# Patient Record
Sex: Male | Born: 1985 | Race: White | Hispanic: No | Marital: Married | Smoking: Never smoker
Health system: Southern US, Community
[De-identification: ages and names within clinical notes are randomized; demographics above are authoritative.]

## PROBLEM LIST (undated history)

## (undated) DIAGNOSIS — I10 Essential (primary) hypertension: Secondary | ICD-10-CM

## (undated) DIAGNOSIS — J45990 Exercise induced bronchospasm: Secondary | ICD-10-CM

---

## 2005-01-09 ENCOUNTER — Emergency Department (HOSPITAL_COMMUNITY): Admission: EM | Admit: 2005-01-09 | Discharge: 2005-01-09 | Payer: Self-pay | Admitting: Emergency Medicine

## 2009-02-21 ENCOUNTER — Encounter: Admission: RE | Admit: 2009-02-21 | Discharge: 2009-02-21 | Payer: Self-pay | Admitting: Family Medicine

## 2012-02-18 ENCOUNTER — Other Ambulatory Visit: Payer: Self-pay | Admitting: Occupational Medicine

## 2012-02-18 ENCOUNTER — Ambulatory Visit
Admission: RE | Admit: 2012-02-18 | Discharge: 2012-02-18 | Disposition: A | Discharge status: Home / Self Care | Payer: No Typology Code available for payment source | Source: Ambulatory Visit | Attending: Occupational Medicine | Admitting: Occupational Medicine

## 2012-02-18 DIAGNOSIS — Z021 Encounter for pre-employment examination: Secondary | ICD-10-CM

## 2014-05-02 ENCOUNTER — Encounter (HOSPITAL_COMMUNITY): Payer: Self-pay | Admitting: Emergency Medicine

## 2014-05-02 ENCOUNTER — Emergency Department (INDEPENDENT_AMBULATORY_CARE_PROVIDER_SITE_OTHER): Payer: Worker's Compensation

## 2014-05-02 ENCOUNTER — Emergency Department (INDEPENDENT_AMBULATORY_CARE_PROVIDER_SITE_OTHER)
Admission: EM | Admit: 2014-05-02 | Discharge: 2014-05-02 | Disposition: A | Discharge status: Home / Self Care | Payer: Worker's Compensation | Source: Home / Self Care | Attending: Family Medicine | Admitting: Family Medicine

## 2014-05-02 DIAGNOSIS — T148 Other injury of unspecified body region: Secondary | ICD-10-CM

## 2014-05-02 DIAGNOSIS — IMO0002 Reserved for concepts with insufficient information to code with codable children: Secondary | ICD-10-CM

## 2014-05-02 MED ORDER — CEPHALEXIN 500 MG PO CAPS: 500.0000 mg | ORAL_CAPSULE | Freq: Two times a day (BID) | ORAL | Status: DC

## 2014-05-02 NOTE — ED Notes (Signed)
Pt was at work and was knocking on a window and his arm went the the window causing laceration to right arm.

## 2014-05-02 NOTE — ED Provider Notes (Addendum)
CSN: [id number]     Arrival date & time 05/02/14  1855 History   First MD Initiated Contact with Patient 05/02/14 1941     Chief Complaint  Patient presents with  . Extremity Laceration   (Consider location/radiation/quality/duration/timing/severity/associated sxs/prior Treatment) HPI  R forearm extremity laceration. Occurred at 18:30. Arm went through the window at work. This was an accident patient does not endorse potentially putting his arm to the window. Patient states that sensation and movement of all digits and hand are intact. Copious bleeding which was stopped with gauze and pressure. Symptoms constant but not getting worse. Denies chest pain, palpitations, shortness of breath, headache, syncope, lightheadedness, dizziness.    History reviewed. No pertinent past medical history. History reviewed. No pertinent past surgical history. Family History  Problem Relation Age of Onset  . Hypertension Father    History  Substance Use Topics  . Smoking status: Never Smoker   . Smokeless tobacco: Not on file  . Alcohol Use: No    Review of Systems Per HPI with all other pertinent systems negative.   Allergies  Review of patient's allergies indicates no known allergies.  Home Medications   Prior to Admission medications   Not on File   BP 140/87 mmHg  Pulse 82  Temp(Src) 98.5 F (36.9 C) (Oral)  Resp 18  SpO2 96% Physical Exam Physical Exam  Constitutional: oriented to person, place, and time. appears well-developed and well-nourished. No distress.  HENT:  Head: Normocephalic and atraumatic.  Eyes: EOMI. PERRL.  Neck: Normal range of motion.  Cardiovascular: RRR, no m/r/g, 2+ distal pulses,  Pulmonary/Chest: Effort normal and breath sounds normal. No respiratory distress.  Abdominal: Soft. Bowel sounds are normal. NonTTP, no distension.  Musculoskeletal: Fingers and hand with full range of motion, sensation intact, less than 2 second cap refill.  Neurological: alert  and oriented to person, place, and time.  Skin: Large oblique, angulated wound on the medial surface of the right forearm with skin retraction distally.Marland Kitchen.  Psychiatric: normal mood and affect. behavior is normal. Judgment and thought content normal.          ED Course  LACERATION REPAIR Date/Time: 05/02/2014 8:21 PM Performed by: Konrad DoloresMERRELL, Shaan Rhoads J Authorized by: Konrad DoloresMERRELL, Bethel Gaglio J Consent: Verbal consent obtained. Risks and benefits: risks, benefits and alternatives were discussed Consent given by: patient Patient identity confirmed: verbally with patient Location: Right distal forearm. Laceration length: 2.7 cm Irrigation solution: Betadine and saline. Amount of cleaning: standard Debridement: none Degree of undermining: none Skin closure: 3-0 Prolene Subcutaneous closure: 4-0 Vicryl Number of sutures: 5 subcuticular and 7 superficial. Technique: simple Approximation: close Approximation difficulty: complex Dressing: antibiotic ointment Patient tolerance: Patient tolerated the procedure well with no immediate complications   (including critical care time) Labs Review Labs Reviewed - No data to display  Imaging Review No results found.   MDM   1. Laceration    Laceration repair as above. Tetanus shot up-to-date. No nerve, artery, tendon, ligament involvement. No foreign body noted on x-ray. Keflex 500 mg twice a day 3 days prophylactically. Anabolic ointment daily and daily warm soapy water washing until sutures are removed in 10-14 days. Sutures removed in 10 days patient to apply Steri-Strips. Very strict return precautions discussed with regards to physical activity.   Ozella Rocksavid J Aleane Wesenberg, MD 05/02/14 [id number]  Ozella Rocksavid J Tayden Nichelson, MD 05/02/14 2136

## 2014-05-02 NOTE — Discharge Instructions (Signed)
The cut to your arm forcefully did not injure any major tendons ligaments blood vessels or nerves. This was repaired using subcutaneous and superficial sutures. The subcutaneous sutures will dissolve over the course of several weeks. Please take the superficial sutures out in 10-14 days. Please apply antibiotic ointment every day until the sutures come out. Please apply Steri-Strips to the cut if you take the sutures out in 10 days. The actual wound should be completely closed initially allowed to work and as little as 5 days. Please avoid any direct trauma to your arm until the sutures are out. Please take the antibiotics as prescribed.

## 2014-07-19 ENCOUNTER — Encounter (HOSPITAL_COMMUNITY)
Admission: EM | Disposition: A | Discharge status: Home / Self Care | Payer: Self-pay | Source: Home / Self Care | Attending: Emergency Medicine

## 2014-07-19 ENCOUNTER — Observation Stay (HOSPITAL_COMMUNITY): Payer: 59 | Admitting: Anesthesiology

## 2014-07-19 ENCOUNTER — Encounter (HOSPITAL_COMMUNITY): Payer: Self-pay | Admitting: Physical Medicine and Rehabilitation

## 2014-07-19 ENCOUNTER — Emergency Department (HOSPITAL_COMMUNITY): Payer: 59

## 2014-07-19 ENCOUNTER — Observation Stay (HOSPITAL_COMMUNITY)
Admission: EM | Admit: 2014-07-19 | Discharge: 2014-07-20 | Disposition: A | Discharge status: Home / Self Care | Payer: 59 | Attending: General Surgery | Admitting: General Surgery

## 2014-07-19 DIAGNOSIS — K429 Umbilical hernia without obstruction or gangrene: Secondary | ICD-10-CM | POA: Insufficient documentation

## 2014-07-19 DIAGNOSIS — N2889 Other specified disorders of kidney and ureter: Secondary | ICD-10-CM | POA: Diagnosis not present

## 2014-07-19 DIAGNOSIS — I10 Essential (primary) hypertension: Secondary | ICD-10-CM | POA: Insufficient documentation

## 2014-07-19 DIAGNOSIS — K5730 Diverticulosis of large intestine without perforation or abscess without bleeding: Secondary | ICD-10-CM | POA: Insufficient documentation

## 2014-07-19 DIAGNOSIS — R112 Nausea with vomiting, unspecified: Secondary | ICD-10-CM | POA: Diagnosis present

## 2014-07-19 DIAGNOSIS — K37 Unspecified appendicitis: Secondary | ICD-10-CM | POA: Diagnosis present

## 2014-07-19 DIAGNOSIS — K353 Acute appendicitis with localized peritonitis, without perforation or gangrene: Secondary | ICD-10-CM

## 2014-07-19 DIAGNOSIS — K3580 Unspecified acute appendicitis: Principal | ICD-10-CM | POA: Insufficient documentation

## 2014-07-19 HISTORY — DX: Exercise induced bronchospasm: J45.990

## 2014-07-19 HISTORY — PX: APPENDECTOMY: SHX54

## 2014-07-19 HISTORY — DX: Essential (primary) hypertension: I10

## 2014-07-19 HISTORY — PX: LAPAROSCOPIC APPENDECTOMY: SHX408

## 2014-07-19 LAB — COMPREHENSIVE METABOLIC PANEL
ALBUMIN: 4.3 g/dL (ref 3.5–5.0)
ALK PHOS: 71 U/L (ref 38–126)
ALT: 45 U/L (ref 17–63)
AST: 30 U/L (ref 15–41)
Anion gap: 10 (ref 5–15)
BUN: 12 mg/dL (ref 6–20)
CO2: 27 mmol/L (ref 22–32)
Calcium: 9.5 mg/dL (ref 8.9–10.3)
Chloride: 100 mmol/L — ABNORMAL LOW (ref 101–111)
Creatinine, Ser: 1.12 mg/dL (ref 0.61–1.24)
GFR calc Af Amer: >60 mL/min (ref >60)
GFR calc non Af Amer: >60 mL/min (ref >60)
GLUCOSE: 111 mg/dL — AB (ref 65–99)
Potassium: 4.2 mmol/L (ref 3.5–5.1)
Sodium: 137 mmol/L (ref 135–145)
TOTAL PROTEIN: 7.4 g/dL (ref 6.5–8.1)
Total Bilirubin: 0.7 mg/dL (ref 0.3–1.2)

## 2014-07-19 LAB — URINALYSIS, ROUTINE W REFLEX MICROSCOPIC
Bilirubin Urine: NEGATIVE
GLUCOSE, UA: NEGATIVE mg/dL
Hgb urine dipstick: NEGATIVE
KETONES UR: NEGATIVE mg/dL
LEUKOCYTES UA: NEGATIVE
Nitrite: NEGATIVE
PROTEIN: NEGATIVE mg/dL
Specific Gravity, Urine: 1.019 (ref 1.005–1.030)
Urobilinogen, UA: 0.2 mg/dL (ref 0.0–1.0)
pH: 7 (ref 5.0–8.0)

## 2014-07-19 LAB — CBC WITH DIFFERENTIAL/PLATELET
Basophils Absolute: 0 10*3/uL (ref 0.0–0.1)
Basophils Relative: 0 % (ref 0–1)
Eosinophils Absolute: 0 10*3/uL (ref 0.0–0.7)
Eosinophils Relative: 0 % (ref 0–5)
HCT: 44.2 % (ref 39.0–52.0)
Hemoglobin: 16.1 g/dL (ref 13.0–17.0)
Lymphocytes Relative: 4 % — ABNORMAL LOW (ref 12–46)
Lymphs Abs: 0.8 10*3/uL (ref 0.7–4.0)
MCH: 29.2 pg (ref 26.0–34.0)
MCHC: 36.4 g/dL — ABNORMAL HIGH (ref 30.0–36.0)
MCV: 80.1 fL (ref 78.0–100.0)
Monocytes Absolute: 0.9 10*3/uL (ref 0.1–1.0)
Monocytes Relative: 5 % (ref 3–12)
Neutro Abs: 17.9 10*3/uL — ABNORMAL HIGH (ref 1.7–7.7)
Neutrophils Relative %: 91 % — ABNORMAL HIGH (ref 43–77)
Platelets: 266 10*3/uL (ref 150–400)
RBC: 5.52 MIL/uL (ref 4.22–5.81)
RDW: 11.6 % (ref 11.5–15.5)
WBC: 19.6 10*3/uL — ABNORMAL HIGH (ref 4.0–10.5)

## 2014-07-19 LAB — LIPASE, BLOOD: Lipase: 12 U/L — ABNORMAL LOW (ref 22–51)

## 2014-07-19 SURGERY — APPENDECTOMY, LAPAROSCOPIC
Anesthesia: General | Site: Abdomen

## 2014-07-19 MED ORDER — DEXTROSE 5 % IV SOLN
2.0000 g | INTRAVENOUS | Status: AC
  Administered 2014-07-19: 2 g via INTRAVENOUS
  Filled 2014-07-19: qty 2

## 2014-07-19 MED ORDER — HYDROMORPHONE HCL 1 MG/ML IJ SOLN
0.2500 mg | INTRAMUSCULAR | Status: DC | PRN
  Administered 2014-07-19 (×2): 0.5 mg via INTRAVENOUS

## 2014-07-19 MED ORDER — ACETAMINOPHEN 650 MG RE SUPP
650.0000 mg | Freq: Four times a day (QID) | RECTAL | Status: DC | PRN
Start: 2014-07-19 — End: 2014-07-20

## 2014-07-19 MED ORDER — LACTATED RINGERS IV SOLN
INTRAVENOUS | Status: DC
Start: 2014-07-19 — End: 2014-07-20
  Administered 2014-07-19 (×3): via INTRAVENOUS

## 2014-07-19 MED ORDER — HYDROMORPHONE HCL 1 MG/ML IJ SOLN
INTRAMUSCULAR | Status: AC
  Filled 2014-07-19: qty 1

## 2014-07-19 MED ORDER — LIDOCAINE HCL (CARDIAC) 20 MG/ML IV SOLN
INTRAVENOUS | Status: DC | PRN
  Administered 2014-07-19: 60 mg via INTRAVENOUS

## 2014-07-19 MED ORDER — GLYCOPYRROLATE 0.2 MG/ML IJ SOLN
INTRAMUSCULAR | Status: DC | PRN
  Administered 2014-07-19: .8 mg via INTRAVENOUS

## 2014-07-19 MED ORDER — ACETAMINOPHEN 325 MG PO TABS: 650.0000 mg | ORAL_TABLET | Freq: Four times a day (QID) | ORAL | Status: DC | PRN

## 2014-07-19 MED ORDER — ONDANSETRON HCL 4 MG/2ML IJ SOLN
4.0000 mg | Freq: Four times a day (QID) | INTRAMUSCULAR | Status: DC | PRN
  Administered 2014-07-19 – 2014-07-20 (×2): 4 mg via INTRAVENOUS
  Filled 2014-07-19 (×3): qty 2

## 2014-07-19 MED ORDER — SODIUM CHLORIDE 0.9 % IR SOLN
Status: DC | PRN
  Administered 2014-07-19: 1000 mL

## 2014-07-19 MED ORDER — BUPIVACAINE-EPINEPHRINE 0.25% -1:200000 IJ SOLN
INTRAMUSCULAR | Status: DC | PRN
  Administered 2014-07-19: 23 mL

## 2014-07-19 MED ORDER — MIDAZOLAM HCL 2 MG/2ML IJ SOLN
INTRAMUSCULAR | Status: AC
  Filled 2014-07-19: qty 2

## 2014-07-19 MED ORDER — PHENYLEPHRINE 40 MCG/ML (10ML) SYRINGE FOR IV PUSH (FOR BLOOD PRESSURE SUPPORT)
PREFILLED_SYRINGE | INTRAVENOUS | Status: AC
  Filled 2014-07-19: qty 10

## 2014-07-19 MED ORDER — LIDOCAINE HCL 4 % MT SOLN
OROMUCOSAL | Status: DC | PRN
  Administered 2014-07-19: 4 mL via TOPICAL

## 2014-07-19 MED ORDER — PHENYLEPHRINE HCL 10 MG/ML IJ SOLN
INTRAMUSCULAR | Status: AC
  Filled 2014-07-19: qty 1

## 2014-07-19 MED ORDER — IOHEXOL 300 MG/ML  SOLN
100.0000 mL | Freq: Once | INTRAMUSCULAR | Status: AC | PRN
  Administered 2014-07-19: 100 mL via INTRAVENOUS

## 2014-07-19 MED ORDER — NEOSTIGMINE METHYLSULFATE 10 MG/10ML IV SOLN
INTRAVENOUS | Status: AC
  Filled 2014-07-19: qty 1

## 2014-07-19 MED ORDER — IOHEXOL 300 MG/ML  SOLN
25.0000 mL | Freq: Once | INTRAMUSCULAR | Status: AC | PRN
  Administered 2014-07-19: 25 mL via ORAL

## 2014-07-19 MED ORDER — HYDROMORPHONE HCL 1 MG/ML IJ SOLN: 0.5000 mg | Freq: Once | INTRAMUSCULAR | Status: DC

## 2014-07-19 MED ORDER — SODIUM CHLORIDE 0.9 % IV SOLN
1000.0000 mL | Freq: Once | INTRAVENOUS | Status: AC
  Administered 2014-07-19: 1000 mL via INTRAVENOUS

## 2014-07-19 MED ORDER — HYDROCODONE-ACETAMINOPHEN 5-325 MG PO TABS
1.0000 | ORAL_TABLET | ORAL | Status: DC | PRN
  Administered 2014-07-20: 2 via ORAL
  Filled 2014-07-19: qty 2

## 2014-07-19 MED ORDER — ROCURONIUM BROMIDE 50 MG/5ML IV SOLN
INTRAVENOUS | Status: AC
  Filled 2014-07-19: qty 3

## 2014-07-19 MED ORDER — GLYCOPYRROLATE 0.2 MG/ML IJ SOLN
INTRAMUSCULAR | Status: AC
  Filled 2014-07-19: qty 10

## 2014-07-19 MED ORDER — DEXTROSE 5 % IV SOLN
INTRAVENOUS | Status: AC
  Filled 2014-07-19: qty 1

## 2014-07-19 MED ORDER — 0.9 % SODIUM CHLORIDE (POUR BTL) OPTIME
TOPICAL | Status: DC | PRN
  Administered 2014-07-19: 1000 mL

## 2014-07-19 MED ORDER — ONDANSETRON HCL 4 MG/2ML IJ SOLN
INTRAMUSCULAR | Status: AC
  Filled 2014-07-19: qty 6

## 2014-07-19 MED ORDER — PROPOFOL 10 MG/ML IV BOLUS
INTRAVENOUS | Status: DC | PRN
  Administered 2014-07-19: 200 mg via INTRAVENOUS

## 2014-07-19 MED ORDER — ONDANSETRON HCL 4 MG/2ML IJ SOLN
INTRAMUSCULAR | Status: DC | PRN
  Administered 2014-07-19: 4 mg via INTRAVENOUS

## 2014-07-19 MED ORDER — NEOSTIGMINE METHYLSULFATE 10 MG/10ML IV SOLN
INTRAVENOUS | Status: DC | PRN
  Administered 2014-07-19: 4 mg via INTRAVENOUS

## 2014-07-19 MED ORDER — BUPIVACAINE-EPINEPHRINE (PF) 0.25% -1:200000 IJ SOLN
INTRAMUSCULAR | Status: AC
  Filled 2014-07-19: qty 30

## 2014-07-19 MED ORDER — FENTANYL CITRATE (PF) 100 MCG/2ML IJ SOLN
INTRAMUSCULAR | Status: DC | PRN
  Administered 2014-07-19: 50 ug via INTRAVENOUS
  Administered 2014-07-19: 150 ug via INTRAVENOUS
  Administered 2014-07-19: 50 ug via INTRAVENOUS

## 2014-07-19 MED ORDER — DIPHENHYDRAMINE HCL 50 MG/ML IJ SOLN: 12.5000 mg | Freq: Four times a day (QID) | INTRAMUSCULAR | Status: DC | PRN

## 2014-07-19 MED ORDER — HYDROMORPHONE HCL 1 MG/ML IJ SOLN
0.5000 mg | INTRAMUSCULAR | Status: DC | PRN
  Administered 2014-07-19 – 2014-07-20 (×3): 1 mg via INTRAVENOUS
  Filled 2014-07-19 (×3): qty 1

## 2014-07-19 MED ORDER — HYDROCHLOROTHIAZIDE 12.5 MG PO CAPS
12.5000 mg | ORAL_CAPSULE | Freq: Every day | ORAL | Status: DC
  Administered 2014-07-20: 12.5 mg via ORAL
  Filled 2014-07-19: qty 1

## 2014-07-19 MED ORDER — POTASSIUM CHLORIDE IN NACL 20-0.9 MEQ/L-% IV SOLN
INTRAVENOUS | Status: DC
  Administered 2014-07-19: 17:00:00 via INTRAVENOUS
  Administered 2014-07-20: 1 mL via INTRAVENOUS
  Filled 2014-07-19 (×4): qty 1000

## 2014-07-19 MED ORDER — MIDAZOLAM HCL 5 MG/5ML IJ SOLN
INTRAMUSCULAR | Status: DC | PRN
  Administered 2014-07-19: 2 mg via INTRAVENOUS

## 2014-07-19 MED ORDER — SUCCINYLCHOLINE CHLORIDE 20 MG/ML IJ SOLN
INTRAMUSCULAR | Status: DC | PRN
  Administered 2014-07-19: 120 mg via INTRAVENOUS

## 2014-07-19 MED ORDER — ARTIFICIAL TEARS OP OINT
TOPICAL_OINTMENT | OPHTHALMIC | Status: AC
  Filled 2014-07-19: qty 3.5

## 2014-07-19 MED ORDER — ROCURONIUM BROMIDE 100 MG/10ML IV SOLN
INTRAVENOUS | Status: DC | PRN
  Administered 2014-07-19: 5 mg via INTRAVENOUS
  Administered 2014-07-19: 20 mg via INTRAVENOUS
  Administered 2014-07-19: 10 mg via INTRAVENOUS

## 2014-07-19 MED ORDER — DIPHENHYDRAMINE HCL 12.5 MG/5ML PO ELIX: 12.5000 mg | ORAL_SOLUTION | Freq: Four times a day (QID) | ORAL | Status: DC | PRN

## 2014-07-19 MED ORDER — ONDANSETRON HCL 4 MG/2ML IJ SOLN: 4.0000 mg | Freq: Once | INTRAMUSCULAR | Status: DC | PRN

## 2014-07-19 MED ORDER — LIDOCAINE HCL (CARDIAC) 20 MG/ML IV SOLN
INTRAVENOUS | Status: AC
  Filled 2014-07-19: qty 25

## 2014-07-19 MED ORDER — FENTANYL CITRATE (PF) 250 MCG/5ML IJ SOLN
INTRAMUSCULAR | Status: AC
  Filled 2014-07-19: qty 5

## 2014-07-19 MED ORDER — ONDANSETRON HCL 4 MG/2ML IJ SOLN
4.0000 mg | Freq: Once | INTRAMUSCULAR | Status: AC
  Administered 2014-07-19: 4 mg via INTRAVENOUS
  Filled 2014-07-19: qty 2

## 2014-07-19 MED ORDER — PROPOFOL 10 MG/ML IV BOLUS
INTRAVENOUS | Status: AC
  Filled 2014-07-19: qty 20

## 2014-07-19 SURGICAL SUPPLY — 40 items
APPLIER CLIP ROT 10 11.4 M/L (STAPLE)
BLADE SURG ROTATE 9660 (MISCELLANEOUS) ×2 IMPLANT
CANISTER SUCTION 2500CC (MISCELLANEOUS) ×2 IMPLANT
CHLORAPREP W/TINT 26ML (MISCELLANEOUS) ×2 IMPLANT
CLIP APPLIE ROT 10 11.4 M/L (STAPLE) IMPLANT
COVER SURGICAL LIGHT HANDLE (MISCELLANEOUS) ×2 IMPLANT
CUTTER FLEX LINEAR 45M (STAPLE) ×2 IMPLANT
ELECT REM PT RETURN 9FT ADLT (ELECTROSURGICAL) ×2
ELECTRODE REM PT RTRN 9FT ADLT (ELECTROSURGICAL) ×1 IMPLANT
ENDOLOOP SUT PDS II  0 18 (SUTURE)
ENDOLOOP SUT PDS II 0 18 (SUTURE) IMPLANT
GLOVE BIO SURGEON STRL SZ 6.5 (GLOVE) ×2 IMPLANT
GLOVE BIO SURGEON STRL SZ7.5 (GLOVE) ×4 IMPLANT
GLOVE BIOGEL PI IND STRL 6.5 (GLOVE) ×1 IMPLANT
GLOVE BIOGEL PI IND STRL 7.5 (GLOVE) ×1 IMPLANT
GLOVE BIOGEL PI INDICATOR 6.5 (GLOVE) ×1
GLOVE BIOGEL PI INDICATOR 7.5 (GLOVE) ×1
GLOVE SURG SS PI 6.5 STRL IVOR (GLOVE) ×2 IMPLANT
GOWN STRL REUS W/ TWL LRG LVL3 (GOWN DISPOSABLE) ×4 IMPLANT
GOWN STRL REUS W/TWL LRG LVL3 (GOWN DISPOSABLE) ×4
KIT BASIN OR (CUSTOM PROCEDURE TRAY) ×2 IMPLANT
KIT ROOM TURNOVER OR (KITS) ×2 IMPLANT
LIQUID BAND (GAUZE/BANDAGES/DRESSINGS) ×2 IMPLANT
NS IRRIG 1000ML POUR BTL (IV SOLUTION) ×2 IMPLANT
PAD ARMBOARD 7.5X6 YLW CONV (MISCELLANEOUS) ×4 IMPLANT
POUCH SPECIMEN RETRIEVAL 10MM (ENDOMECHANICALS) ×2 IMPLANT
RELOAD STAPLE TA45 3.5 REG BLU (ENDOMECHANICALS) ×2 IMPLANT
SCALPEL HARMONIC ACE (MISCELLANEOUS) ×2 IMPLANT
SET IRRIG TUBING LAPAROSCOPIC (IRRIGATION / IRRIGATOR) ×2 IMPLANT
SPECIMEN JAR SMALL (MISCELLANEOUS) ×2 IMPLANT
SURESTEP FOLEY TRAY SYSTEM ×2 IMPLANT
SUT MNCRL AB 4-0 PS2 18 (SUTURE) ×2 IMPLANT
SUT VICRYL 0 UR6 27IN ABS (SUTURE) ×2 IMPLANT
TOWEL OR 17X24 6PK STRL BLUE (TOWEL DISPOSABLE) ×2 IMPLANT
TOWEL OR 17X26 10 PK STRL BLUE (TOWEL DISPOSABLE) ×2 IMPLANT
TRAY FOLEY CATH 16FR SILVER (SET/KITS/TRAYS/PACK) ×2 IMPLANT
TRAY LAPAROSCOPIC (CUSTOM PROCEDURE TRAY) ×2 IMPLANT
TROCAR XCEL BLUNT TIP 100MML (ENDOMECHANICALS) ×2 IMPLANT
TROCAR XCEL NON-BLD 5MMX100MML (ENDOMECHANICALS) ×4 IMPLANT
TUBING INSUFFLATION (TUBING) ×2 IMPLANT

## 2014-07-19 NOTE — Op Note (Signed)
07/19/2014  3:33 PM  PATIENT:  Willie Wood  29 y.o. male  PRE-OPERATIVE DIAGNOSIS:  ACUTE APPENDICITS  POST-OPERATIVE DIAGNOSIS:  ACUTE APPENDICITS, umbilical hernia  PROCEDURE:  Procedure(s): APPENDECTOMY LAPAROSCOPIC (N/A)  Repair umbilical hernia  SURGEON:  Surgeon(s) and Role:    * Griselda Miner, MD - Primary  PHYSICIAN ASSISTANT:   ASSISTANTS: Magnus Ivan, RNFA   ANESTHESIA:   general  EBL:  Total I/O In: 1000 [I.V.:1000] Out: -   BLOOD ADMINISTERED:none  DRAINS: none   LOCAL MEDICATIONS USED:  MARCAINE     SPECIMEN:  Source of Specimen:  appendix  DISPOSITION OF SPECIMEN:  PATHOLOGY  COUNTS:  YES  TOURNIQUET:  * No tourniquets in log *  DICTATION: .Dragon Dictation  After informed consent was obtained patient was brought to the operating room placed in the supine position on the operating room table. After adequate induction of general anesthesia the patient's abdomen was prepped with ChloraPrep, allowed to dry, and draped in usual sterile manner. The area below the umbilicus was infiltrated with quarter percent Marcaine. A small incision was made with a 15 blade knife. This incision was carried down through the subcutaneous tissue bluntly with a hemostat and [employer]-Nav[employer] retractors until the linea alba was identified. The linea alba was incised with a 15 blade knife. Each side was grasped Coker clamps and elevated anteriorly. The preperitoneal space was probed bluntly with a hemostat until the peritoneum was opened and access was gained to the abdominal cavity. A 0 Vicryl purse string stitch was placed in the fascia surrounding the opening. A Hassan cannula was placed through the opening and anchored in place with the previously placed Vicryl purse string stitch. The laparoscope was placed through the [facility] cannula. The abdomen was insufflated with carbon dioxide without difficulty. Next the suprapubic area was infiltrated with quarter percent Marcaine. A  small incision was made with a 15 blade knife. A 5 mm port was placed bluntly through this incision into the abdominal cavity. A site was then chosen between the 2 port for placement of a 5 mm port. The area was infiltrated with quarter percent Marcaine. A small stab incision was made with a 15 blade knife. A 5 mm port was placed bluntly through this incision and the abdominal cavity under direct vision. The laparoscope was then moved to the suprapubic port. Using a Glassman grasper and harmonic scalpel the right lower quadrant was inspected. The appendix was readily identified. The appendix was elevated anteriorly and the mesoappendix was taken down sharply with the harmonic scalpel. Once the base of the appendix where it joined the cecum was identified and cleared of any tissue then a laparoscopic GIA blue load 6 row stapler was placed through the [facility] cannula. The stapler was placed across the base of the appendix clamped and fired thereby dividing the base of the appendix between staple lines. A laparoscopic bag was then inserted through the [facility] cannula. The appendix was placed within the bag and the bag was sealed. The abdomen was then irrigated with copious amounts of saline until the effluent was clear. No other abnormalities were noted. The appendix and bag were removed with the [facility] cannula through the infraumbilical port without difficulty. There was an umbilical hernia that we placed the [facility] canula through. The incision was extended so the fascia edges could be cleaned. The fascial defect was closed with the previously placed Vicryl pursestring stitch as well as with another 2 interrupted 0 Vicryl figure-of-eight stitch. The rest of  the ports were removed under direct vision and were found to be hemostatic. The gas was allowed to escape. The skin incisions were closed with interrupted 4-0 Monocryl subcuticular stitches. Dermabond dressings were applied. The patient tolerated the procedure well.  At the end of the case all needle sponge and instrument counts were correct. The patient was then awakened and taken to recovery in stable condition.  PLAN OF CARE: Admit for overnight observation  PATIENT DISPOSITION:  PACU - hemodynamically stable.   Delay start of Pharmacological VTE agent (>24hrs) due to surgical blood loss or risk of bleeding: no

## 2014-07-19 NOTE — Transfer of Care (Signed)
Immediate Anesthesia Transfer of Care Note  Patient: Willie Wood  Procedure(s) Performed: Procedure(s): APPENDECTOMY LAPAROSCOPIC (N/A)  Patient Location: PACU  Anesthesia Type:General  Level of Consciousness: sedated  Airway & Oxygen Therapy: Patient Spontanous Breathing  Post-op Assessment: Report given to RN and Post -op Vital signs reviewed and stable  Post vital signs: Reviewed and stable  Last Vitals:  Filed Vitals:   07/19/14 1100  BP: 128/70  Pulse: 84  Temp:   Resp: 18    Complications: No apparent anesthesia complications

## 2014-07-19 NOTE — ED Notes (Signed)
Pt transported to CT scan.

## 2014-07-19 NOTE — ED Notes (Signed)
Pt presents to department for evaluation of RLQ abdominal pain. Onset last night. Also reports nausea/vomiting. 5/10 pain upon arrival to ED. Pt is alert and oriented x4.

## 2014-07-19 NOTE — Anesthesia Postprocedure Evaluation (Signed)
  Anesthesia Post-op Note  Patient: Willie Wood  Procedure(s) Performed: Procedure(s): APPENDECTOMY LAPAROSCOPIC (N/A)  Patient Location: PACU  Anesthesia Type:General  Level of Consciousness: awake, alert  and oriented  Airway and Oxygen Therapy: Patient Spontanous Breathing and Patient connected to nasal cannula oxygen  Post-op Pain: mild  Post-op Assessment: Post-op Vital signs reviewed, Patient's Cardiovascular Status Stable, Respiratory Function Stable, Patent Airway and Pain level controlled              Post-op Vital Signs: stable  Last Vitals:  Filed Vitals:   07/19/14 1657  BP: 134/78  Pulse: 76  Temp:   Resp: 16    Complications: No apparent anesthesia complications

## 2014-07-19 NOTE — Anesthesia Preprocedure Evaluation (Signed)
Anesthesia Evaluation  Patient identified by MRN, date of birth, ID band Patient awake    Reviewed: Allergy & Precautions, NPO status , Patient's Chart, lab work & pertinent test results  Airway Mallampati: II  TM Distance: >3 FB Neck ROM: Full    Dental  (+) Teeth Intact, Dental Advisory Given   Pulmonary  breath sounds clear to auscultation        Cardiovascular hypertension, Rhythm:Regular Rate:Normal     Neuro/Psych    GI/Hepatic   Endo/Other    Renal/GU      Musculoskeletal   Abdominal   Peds  Hematology   Anesthesia Other Findings   Reproductive/Obstetrics                             Anesthesia Physical Anesthesia Plan  ASA: II  Anesthesia Plan: General   Post-op Pain Management:    Induction: Cricoid pressure planned and Rapid sequence  Airway Management Planned: Oral ETT  Additional Equipment:   Intra-op Plan:   Post-operative Plan:   Informed Consent: I have reviewed the patients History and Physical, chart, labs and discussed the procedure including the risks, benefits and alternatives for the proposed anesthesia with the patient or authorized representative who has indicated his/her understanding and acceptance.   Dental advisory given  Plan Discussed with: CRNA and Anesthesiologist  Anesthesia Plan Comments:         Anesthesia Quick Evaluation

## 2014-07-19 NOTE — H&P (Signed)
[facility] Admission Note  Benito Lemmerman 06-12-1985  [id number].    Requesting MD: Dr. Vanita Panda Chief Complaint/Reason for Consult: Acute appendicitis  HPI:  29 y/o white male presented to [facility] with acute onset of RLQ abdominal pain which started at 11pm.  He c/o nausea, 3 episodes of vomiting, chills, and diarrhea.  5/10 pain, non radiating.  No precipitating or alleviating factors.  Never had anything like this before, he thought it was gas so he tried walking and handstands in the middle of the night to try to relieve the gas.  His wife woke up and told him he needed to go to the ER.  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  No h/o gastrointestinal problems.  H/o umbilical hernia without treatment.  No h/o abdominal surgeries.  Works as a Airline pilot.  ROS: All systems reviewed and otherwise negative except for as above  Family History  Problem Relation Age of Onset  . Hypertension Father     Past Medical History  Diagnosis Date  . Hypertension     HCTZ  . Asthma     EIA as a kid    History reviewed. No pertinent past surgical history.  Social History:  reports that he has never smoked. He does not have any smokeless tobacco history on file. He reports that he does not drink alcohol. His drug history is not on file.  Allergies: No Known Allergies   (Not in a hospital admission)  Blood pressure 128/70, pulse 84, temperature 98.3 F (36.8 C), temperature source Oral, resp. rate 18, SpO2 100 %. Physical Exam: General: pleasant, WD/WN white male who is laying in bed in NAD HEENT: head is normocephalic, atraumatic.  Sclera are noninjected.  PERRL.  Ears and nose without any masses or lesions.  Mouth is pink and moist Heart: regular, rate, and rhythm.  No obvious murmurs, gallops, or rubs noted.  Palpable pedal pulses bilaterally Lungs: CTAB, no wheezes, rhonchi, or rales noted.  Respiratory effort nonlabored Abd: soft, tender in RLQ, no rebound  or guarding, +BS, no masses, or organomegaly, small umbilical hernia which is easily reducible, some tenderness MS: all 4 extremities are symmetrical with no cyanosis, clubbing, or edema. Skin: warm and dry with no masses, lesions, or rashes Psych: A&Ox3 with an appropriate affect.   Results for orders placed or performed during the hospital encounter of 07/19/14 (from the past 48 hour(s))  CBC with Differential     Status: Abnormal   Collection Time: 07/19/14  7:25 AM  Result Value Ref Range   WBC 19.6 (H) 4.0 - 10.5 K/uL   RBC 5.52 4.22 - 5.81 MIL/uL   Hemoglobin 16.1 13.0 - 17.0 g/dL   HCT 44.2 39.0 - 52.0 %   MCV 80.1 78.0 - 100.0 fL   MCH 29.2 26.0 - 34.0 pg   MCHC 36.4 (H) 30.0 - 36.0 g/dL   RDW 11.6 11.5 - 15.5 %   Platelets 266 150 - 400 K/uL   Neutrophils Relative % 91 (H) 43 - 77 %   Neutro Abs 17.9 (H) 1.7 - 7.7 K/uL   Lymphocytes Relative 4 (L) 12 - 46 %   Lymphs Abs 0.8 0.7 - 4.0 K/uL   Monocytes Relative 5 3 - 12 %   Monocytes Absolute 0.9 0.1 - 1.0 K/uL   Eosinophils Relative 0 0 - 5 %   Eosinophils Absolute 0.0 0.0 - 0.7 K/uL   Basophils Relative 0 0 - 1 %   Basophils  Absolute 0.0 0.0 - 0.1 K/uL  Comprehensive metabolic panel     Status: Abnormal   Collection Time: 07/19/14  7:25 AM  Result Value Ref Range   Sodium 137 135 - 145 mmol/L   Potassium 4.2 3.5 - 5.1 mmol/L   Chloride 100 (L) 101 - 111 mmol/L   CO2 27 22 - 32 mmol/L   Glucose, Bld 111 (H) 65 - 99 mg/dL   BUN 12 6 - 20 mg/dL   Creatinine, Ser 1.12 0.61 - 1.24 mg/dL   Calcium 9.5 8.9 - 10.3 mg/dL   Total Protein 7.4 6.5 - 8.1 g/dL   Albumin 4.3 3.5 - 5.0 g/dL   AST 30 15 - 41 U/L   ALT 45 17 - 63 U/L   Alkaline Phosphatase 71 38 - 126 U/L   Total Bilirubin 0.7 0.3 - 1.2 mg/dL   GFR calc non Af Amer >60 >60 mL/min   GFR calc Af Amer >60 >60 mL/min    Comment: (NOTE) The eGFR has been calculated using the CKD EPI equation. This calculation has not been validated in all clinical  situations. eGFR's persistently <60 mL/min signify possible Chronic Kidney Disease.    Anion gap 10 5 - 15  Lipase, blood     Status: Abnormal   Collection Time: 07/19/14  7:25 AM  Result Value Ref Range   Lipase 12 (L) 22 - 51 U/L  Urinalysis, Routine w reflex microscopic (not at [facility])     Status: None   Collection Time: 07/19/14  9:05 AM  Result Value Ref Range   Color, Urine YELLOW YELLOW   APPearance CLEAR CLEAR   Specific Gravity, Urine 1.019 1.005 - 1.030   pH 7.0 5.0 - 8.0   Glucose, UA NEGATIVE NEGATIVE mg/dL   Hgb urine dipstick NEGATIVE NEGATIVE   Bilirubin Urine NEGATIVE NEGATIVE   Ketones, ur NEGATIVE NEGATIVE mg/dL   Protein, ur NEGATIVE NEGATIVE mg/dL   Urobilinogen, UA 0.2 0.0 - 1.0 mg/dL   Nitrite NEGATIVE NEGATIVE   Leukocytes, UA NEGATIVE NEGATIVE    Comment: MICROSCOPIC NOT DONE ON URINES WITH NEGATIVE PROTEIN, BLOOD, LEUKOCYTES, NITRITE, OR GLUCOSE <1000 mg/dL.   Ct Abdomen Pelvis W Contrast  07/19/2014   CLINICAL DATA:  Acute severe right-sided pain and emesis  EXAM: CT ABDOMEN AND PELVIS WITH CONTRAST  TECHNIQUE: Multidetector CT imaging of the abdomen and pelvis was performed using the standard protocol following bolus administration of intravenous contrast. Oral contrast was also administered.  CONTRAST:  128m OMNIPAQUE IOHEXOL 300 MG/ML  SOLN  COMPARISON:  None.  FINDINGS: There is minimal atelectatic change in lung bases. Lung bases are otherwise clear.  There is hepatic steatosis. No focal liver lesions are identified. Gallbladder wall is not thickened. There is no appreciable biliary duct dilatation.  Spleen, pancreas, and adrenals appear normal.  There is a 1.1 x 0.8 cm mass arising from the posterior mid left kidney which has attenuation values higher than is expected with a cyst. No other renal masses are appreciated. There is no hydronephrosis on either side. There is an extrarenal pelvis on each side, an anatomic variant. There is no renal or ureteral  calculus on either side.  In the pelvis, the urinary bladder is midline with normal wall thickness. There is mild fat in each inguinal ring. There is no pelvic mass or pelvic fluid collection. There are sigmoid diverticula without diverticulitis.  There is thickening of the wall of the appendix with slight enhancement the appendiceal wall and surrounding  mesenteric inflammation. This appearance is consistent with early acute appendiceal inflammation. No abscess is seen in this area.  There is no bowel obstruction.  No free air or portal venous air.  There is no ascites, adenopathy, or abscess in the abdomen or pelvis. There is no abdominal aortic aneurysm. There are no blastic or lytic bone lesions.  IMPRESSION: Evidence of acute appendiceal inflammation.  1.1 x 0.8 cm left renal mass with attenuation values higher than is expected with a simple cyst. Further evaluation with pre and post contrast MRI should be considered. Pre and post contrast CT could alternatively be performed, but would likely be of decreased accuracy given lesion size.  No renal or ureteral calculus. No hydronephrosis. No bowel obstruction. No abscess. There are multiple sigmoid diverticula without diverticulitis.  There is hepatic steatosis.  Critical Value/emergent results were called by telephone at the time of interpretation on 07/19/2014 at 8:57 am to Dr. Carmin Muskrat , who verbally acknowledged these results.   Electronically Signed   By: Lowella Grip III M.D.   On: 07/19/2014 08:57      Assessment/Plan Acute appendicitis Leukocytosis 1.  Admit to [facility] 2.  NPO, bowel rest, IVF, pain control, antiemetics, antibiotics (cefoxitin) 3.  To OR for urgent lap appy.  Explained risks/benefits to patient and his wife.  He would like to proceed with lap appy.  Discussed post-operative recovery and post-op management  Umbilical hernia - reducible, intermittently symptomatic Pre-HTN on HCTZ Left kidney mass 1.1 x 0.8cm - Pt aware,  will need f/u with PCM    Nat Christen, [facility] 07/19/2014, 11:29 AM Pager: [phone]

## 2014-07-19 NOTE — Anesthesia Procedure Notes (Signed)
Procedure Name: Intubation Date/Time: 07/19/2014 2:27 PM Performed by: Suzy Bouchard Pre-anesthesia Checklist: Patient identified, Timeout performed, Emergency Drugs available, Suction available and Patient being monitored Patient Re-evaluated:Patient Re-evaluated prior to inductionOxygen Delivery Method: Circle system utilized Preoxygenation: Pre-oxygenation with 100% oxygen Intubation Type: IV induction, Rapid sequence and Cricoid Pressure applied Laryngoscope Size: Miller and 2 Grade View: Grade I Tube type: Oral Tube size: 7.5 mm Number of attempts: 1 Airway Equipment and Method: Stylet and LTA kit utilized Placement Confirmation: ETT inserted through vocal cords under direct vision,  breath sounds checked- equal and bilateral and positive ETCO2 Secured at: 22 cm Tube secured with: Tape Dental Injury: Teeth and Oropharynx as per pre-operative assessment

## 2014-07-19 NOTE — ED Notes (Signed)
Pt ring and clothiing given to patients wife.

## 2014-07-19 NOTE — ED Provider Notes (Signed)
CSN: [id number]     Arrival date & time 07/19/14  0707 History   First MD Initiated Contact with Patient 07/19/14 [phone]     Chief Complaint  Patient presents with  . Abdominal Pain  . Emesis    HPI  Patient presents with concern of abdominal pain, nausea, vomiting. Symptoms began less than 12 hours ago, initially with diffuse lower abdominal pain. There was associated bloating, cramping sensation. Since onset pain has localized into the right lower quadrant, is sharp, severe. No stool production. No relief with OTC medication. No fever, chills. No chest pain, dyspnea. Patient was well prior to the onset of symptoms, denies a history of anything substantial.  Past Medical History  Diagnosis Date  . Hypertension     HCTZ  . 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 No pertinent past surgical history. Family History  Problem Relation Age of Onset  . Hypertension Father    History  Substance Use Topics  . Smoking status: Never Smoker   . Smokeless tobacco: Not on file  . Alcohol Use: No    Review of Systems  Constitutional:       Per HPI, otherwise negative  HENT:       Per HPI, otherwise negative  Respiratory:       Per HPI, otherwise negative  Cardiovascular:       Per HPI, otherwise negative  Gastrointestinal: Positive for nausea and vomiting.  Endocrine:       Negative aside from HPI  Genitourinary:       Neg aside from HPI   Musculoskeletal:       Per HPI, otherwise negative  Skin: Negative.   Neurological: Negative for syncope.      Allergies  Review of patient's allergies indicates no known allergies.  Home Medications   Prior to Admission medications   Medication Sig Start Date End Date Taking? Authorizing Provider  bismuth subsalicylate (PEPTO BISMOL) 262 MG/15ML suspension Take 30 mLs by mouth once.   Yes Historical Provider, MD  dimenhyDRINATE (DRAMAMINE) 50 MG tablet Take 50 mg by mouth every 8 (eight) hours as needed for nausea.   Yes  Historical Provider, MD  hydrochlorothiazide (MICROZIDE) 12.5 MG capsule Take 12.5 mg by mouth daily.   Yes Historical Provider, MD   BP 124/85 mmHg  Pulse 95  Temp(Src) 97.8 F (36.6 C) (Oral)  Resp 18  SpO2 94% Physical Exam  Constitutional: He is oriented to person, place, and time. He appears well-developed. No distress.  HENT:  Head: Normocephalic and atraumatic.  Eyes: Conjunctivae and EOM are normal.  Cardiovascular: Normal rate and regular rhythm.   Pulmonary/Chest: Effort normal. No stridor. No respiratory distress.  Abdominal: Normal appearance. There is tenderness in the right lower quadrant.  Musculoskeletal: He exhibits no edema.  Neurological: He is alert and oriented to person, place, and time.  Skin: Skin is warm and dry.  Psychiatric: He has a normal mood and affect.  Nursing note and vitals reviewed.   ED Course  Procedures (including critical care time) Labs Review Labs Reviewed  CBC WITH DIFFERENTIAL/PLATELET - Abnormal; Notable for the following:    WBC 19.6 (*)    MCHC 36.4 (*)    Neutrophils Relative % 91 (*)    Neutro Abs 17.9 (*)    Lymphocytes Relative 4 (*)    All other components within normal limits  COMPREHENSIVE METABOLIC PANEL - Abnormal; Notable for the following:    Chloride 100 (*)  Glucose, Bld 111 (*)    All other components within normal limits  LIPASE, BLOOD - Abnormal; Notable for the following:    Lipase 12 (*)    All other components within normal limits  URINALYSIS, ROUTINE W REFLEX MICROSCOPIC (NOT AT [facility])  SURGICAL PATHOLOGY    Imaging Review Ct Abdomen Pelvis W Contrast  07/19/2014   CLINICAL DATA:  Acute severe right-sided pain and emesis  EXAM: CT ABDOMEN AND PELVIS WITH CONTRAST  TECHNIQUE: Multidetector CT imaging of the abdomen and pelvis was performed using the standard protocol following bolus administration of intravenous contrast. Oral contrast was also administered.  CONTRAST:  OMNIPAQUE IOHEXOL 300 MG/ML   SOLN  COMPARISON:  None.  FINDINGS: There is minimal atelectatic change in lung bases. Lung bases are otherwise clear.  There is hepatic steatosis. No focal liver lesions are identified. Gallbladder wall is not thickened. There is no appreciable biliary duct dilatation.  Spleen, pancreas, and adrenals appear normal.  There is a 1.1 x 0.8 cm mass arising from the posterior mid left kidney which has attenuation values higher than is expected with a cyst. No other renal masses are appreciated. There is no hydronephrosis on either side. There is an extrarenal pelvis on each side, an anatomic variant. There is no renal or ureteral calculus on either side.  In the pelvis, the urinary bladder is midline with normal wall thickness. There is mild fat in each inguinal ring. There is no pelvic mass or pelvic fluid collection. There are sigmoid diverticula without diverticulitis.  There is thickening of the wall of the appendix with slight enhancement the appendiceal wall and surrounding mesenteric inflammation. This appearance is consistent with early acute appendiceal inflammation. No abscess is seen in this area.  There is no bowel obstruction.  No free air or portal venous air.  There is no ascites, adenopathy, or abscess in the abdomen or pelvis. There is no abdominal aortic aneurysm. There are no blastic or lytic bone lesions.  IMPRESSION: Evidence of acute appendiceal inflammation.  1.1 x 0.8 cm left renal mass with attenuation values higher than is expected with a simple cyst. Further evaluation with pre and post contrast MRI should be considered. Pre and post contrast CT could alternatively be performed, but would likely be of decreased accuracy given lesion size.  No renal or ureteral calculus. No hydronephrosis. No bowel obstruction. No abscess. There are multiple sigmoid diverticula without diverticulitis.  There is hepatic steatosis.  Critical Value/emergent results were called by telephone at the time of  interpretation on 07/19/2014 at 8:57 am to Dr. Gerhard Munch , who verbally acknowledged these results.   Electronically Signed   By: Bretta Bang III M.D.   On: 07/19/2014 08:57     EKG Interpretation None     Initial labs notable for leukocytosis.   On repeat exam the patient is in no distress. He, his wife, and I had a conversation about appendicitis.  I discussed patient's case with our surgical colleagues, who will assist with further evaluation, intervention.  MDM  Well-appearing male presents with new right lower quadrant pain, nausea, anorexia. Broad differential considered, but given the patient's presentation, appendicitis was the most likely cause, and this was demonstrated on CT scan. Patient received fluid resuscitation, analgesia, was admitted to our surgical colleagues for further evaluation, management, surgery.  Gerhard Munch, MD 07/19/14 1600

## 2014-07-19 NOTE — ED Notes (Signed)
Pt resting quietly at the time. Vital signs stable, denies RLQ abdominal pain at the time. Wife at bedside. Waiting for surgery to consult.

## 2014-07-20 MED ORDER — HYDROCODONE-ACETAMINOPHEN 5-325 MG PO TABS: 1.0000 | ORAL_TABLET | ORAL | Status: DC | PRN

## 2014-07-20 NOTE — Discharge Summary (Signed)
  Patient ID: Willie Wood [id number] 28 y.o. 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  Associated nausea and vomiting and chills.  No prior episodes.  Examination reveals localized tenderness the right lower quadrant.  CT scan was consistent with acute inflammation of the appendix but no sign of complication.  He was admitted and brought to the operating room for appendectomy.  Hospital Course: On the day of admission he was taken to the operating room by Dr. Chevis Pretty and underwent laparoscopic appendectomy.  The surgery was uneventful.  He and some nausea the evening after surgery but that had resolved by the following morning.  On postop day 1 he felt well.  Was urinating reasonably well.  Had been ambulating.  He wanted to have breakfast and then go home.  Examination his abdomen was soft.  Not distended.  All the wounds looked good.  The plan is for him to be discharged this morning if he tolerates breakfast well and ambulates more.  Diet and activities were discussed.  Return to work issues were discussed.  He was given a prescription for Norco for pain.  He was asked to return to see [place] in the office in about 3 weeks.  Consults: None  Significant Diagnostic Studies: Lab work and CT scan abdomen  Treatments: surgery: Laparoscopic appendectomy  Disposition: Home  Patient Instructions:    Medication List    TAKE these medications        bismuth subsalicylate 262 MG/15ML suspension  Commonly known as:   PEPTO BISMOL  Take 30 mLs by mouth once.     DRAMAMINE 50 MG tablet  Generic drug:  dimenhyDRINATE  Take 50 mg by mouth every 8 (eight) hours as needed for nausea.     hydrochlorothiazide 12.5 MG capsule  Commonly known as:  MICROZIDE  Take 12.5 mg by mouth daily.     HYDROcodone-acetaminophen 5-325 MG per tablet  Commonly known as:  NORCO/VICODIN  Take 1-2 tablets by mouth every 4 (four) hours as needed for moderate pain or severe pain.        Activity: No sports or heavy lifting or strenuous work for 3 weeks.  Unlimited thereafter.  Unlimited ambulation.  May return to light duty in 5-7 days. Diet: low fat, low cholesterol diet Wound Care: none needed  Follow-up:  With [facility] surgery in 3 weeks.  Signed: Angelia Mould. Derrell Lolling, M.D., FACS General and minimally invasive surgery Breast and Colorectal Surgery  07/20/2014, 6:22 AM

## 2014-07-20 NOTE — Discharge Planning (Signed)
Copy of AVS to pt and pain rx, verbalizes understanding. Tolerated diet and po pain med. Dc'd per w/c to private car home with all personal belongings, accompanied by wife at 1100.

## 2014-07-23 ENCOUNTER — Encounter (HOSPITAL_COMMUNITY): Payer: Self-pay | Admitting: General Surgery

## 2014-07-24 ENCOUNTER — Ambulatory Visit
Admission: RE | Admit: 2014-07-24 | Discharge: 2014-07-24 | Disposition: A | Discharge status: Home / Self Care | Payer: 59 | Source: Ambulatory Visit | Attending: Physician Assistant | Admitting: Physician Assistant

## 2014-07-24 ENCOUNTER — Other Ambulatory Visit: Payer: Self-pay | Admitting: Physician Assistant

## 2014-07-24 DIAGNOSIS — N2889 Other specified disorders of kidney and ureter: Secondary | ICD-10-CM

## 2014-07-24 MED ORDER — GADOBENATE DIMEGLUMINE 529 MG/ML IV SOLN
20.0000 mL | Freq: Once | INTRAVENOUS | Status: AC | PRN
  Administered 2014-07-24: 20 mL via INTRAVENOUS

## 2014-07-30 ENCOUNTER — Other Ambulatory Visit: Payer: Self-pay

## 2016-07-15 IMAGING — CT CT ABD-PELV W/ CM
2 of 5 series · 16 of 46 positions shown, 18 images · IV contrast (APPLIED)
Comparison: None.

CLINICAL DATA: Acute severe right-sided pain and emesis

EXAM:
CT ABDOMEN AND PELVIS WITH CONTRAST
TECHNIQUE: Multidetector CT imaging of the abdomen and pelvis was performed
using the standard protocol following bolus administration of
intravenous contrast. Oral contrast was also administered.
CONTRAST:  100mL OMNIPAQUE IOHEXOL 300 MG/ML  SOLN

[Series 2: abd/ pelvis 5.0 i30f 1 · axial · 0.77mm/px · z∈[+753,+1238]mm · 13 of 109 slices shown, 15 images]
[im 6/109  soft-tissue]
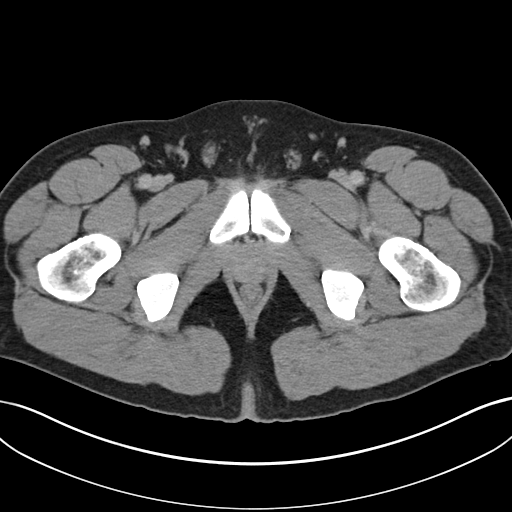
[im 6/109  bone]
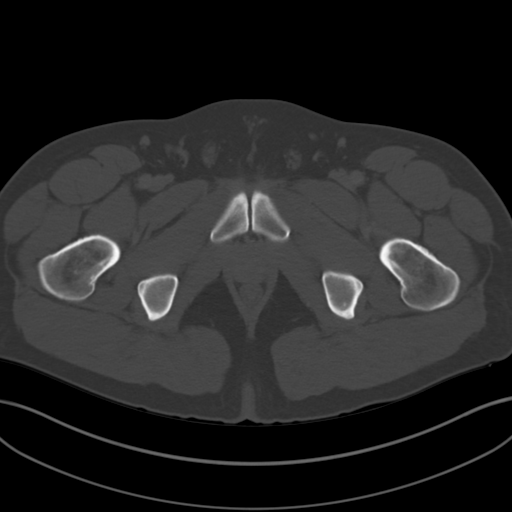
[im 18/109  soft-tissue]
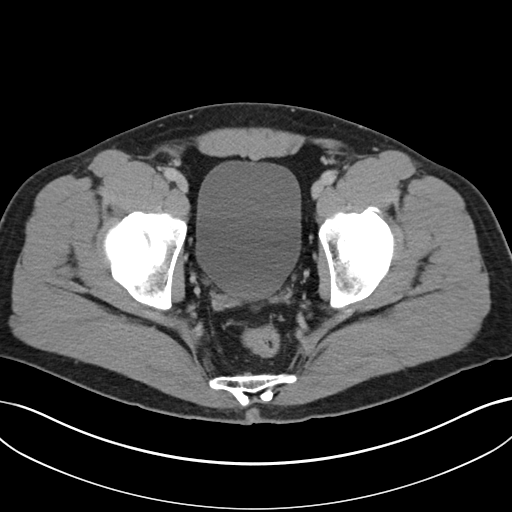
[im 23/109  soft-tissue]
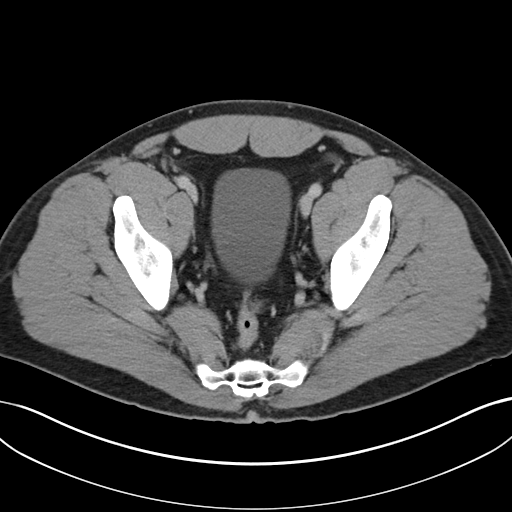
[im 29/109  soft-tissue]
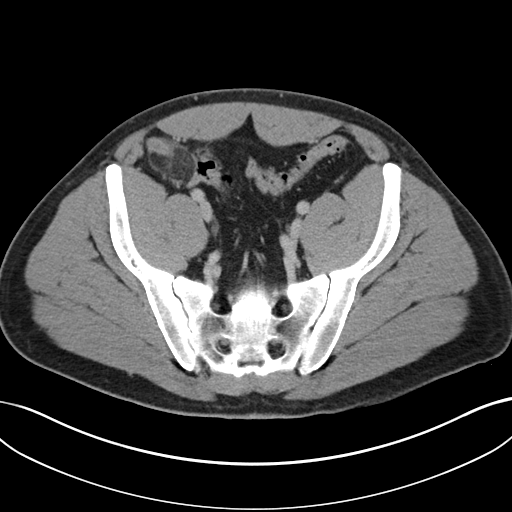
[im 40/109  soft-tissue]
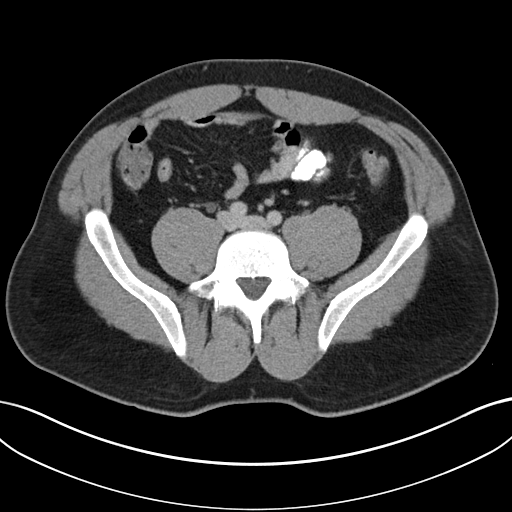
[im 46/109  soft-tissue]
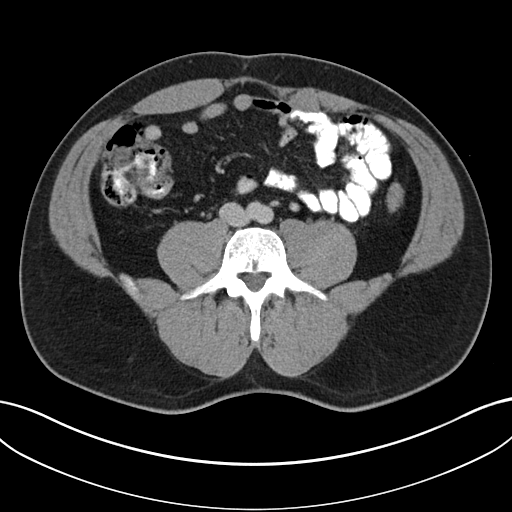
[im 57/109  soft-tissue]
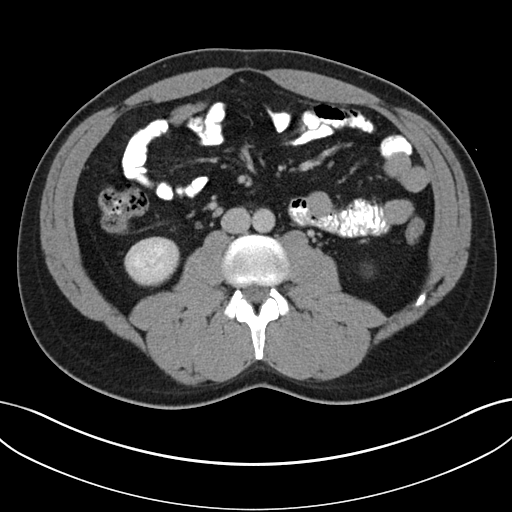
[im 63/109  soft-tissue]
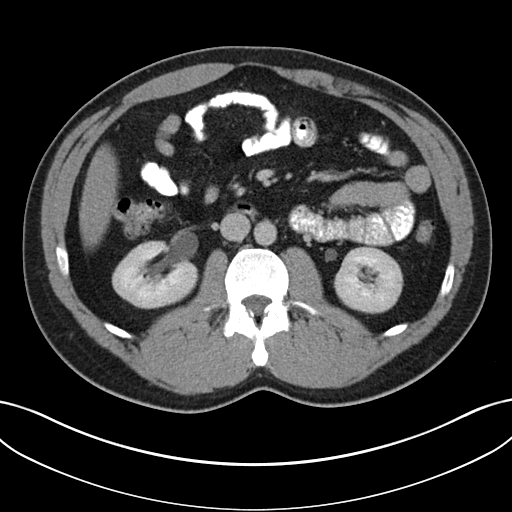
[im 69/109  soft-tissue]
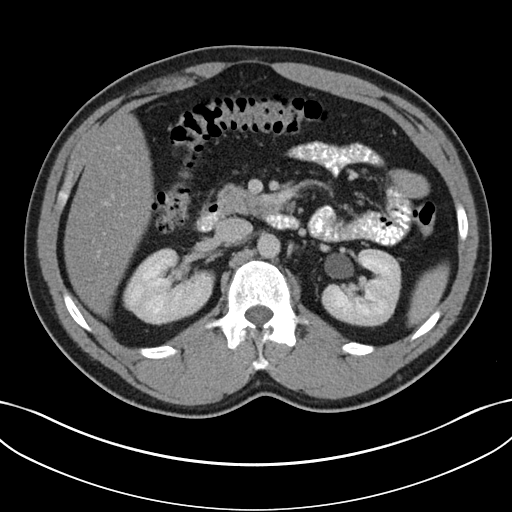
[im 69/109  bone]
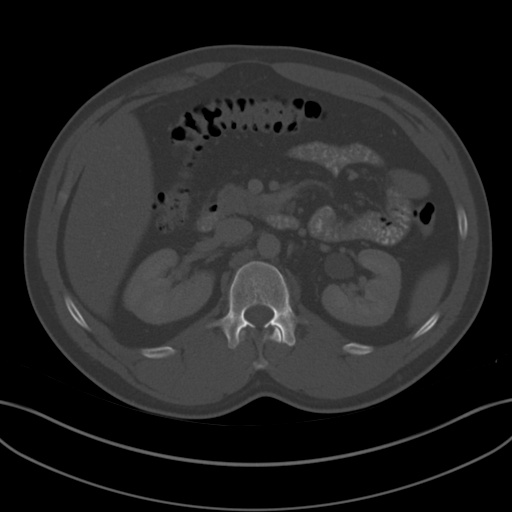
[im 80/109  soft-tissue]
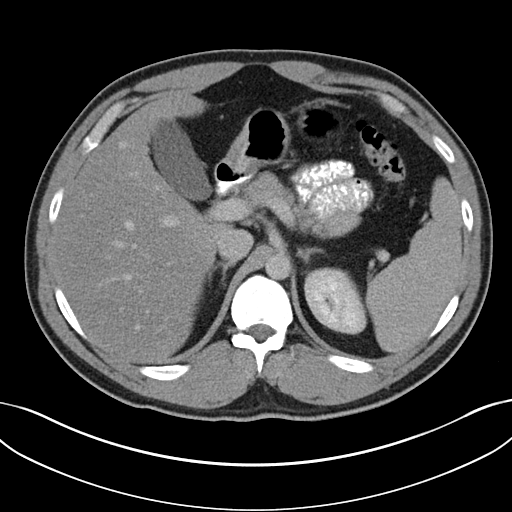
[im 86/109  soft-tissue]
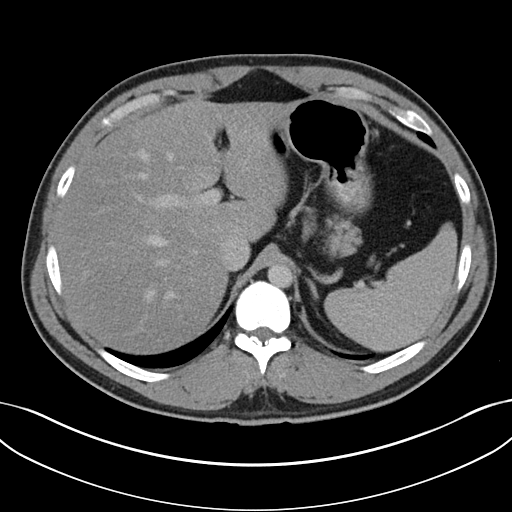
[im 91/109  soft-tissue]
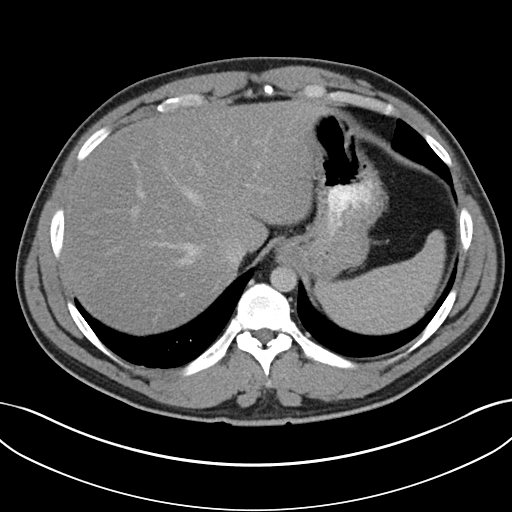
[im 103/109  soft-tissue]
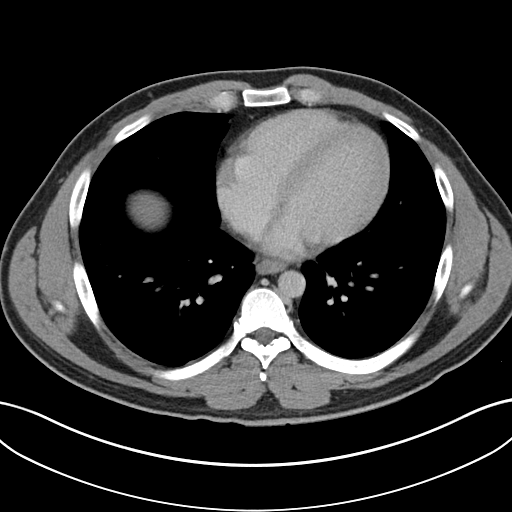

[Series 5: coronal soft tissue · coronal · 1.00mm/px · 3 of 90 slices shown]
[im 30/90  soft-tissue]
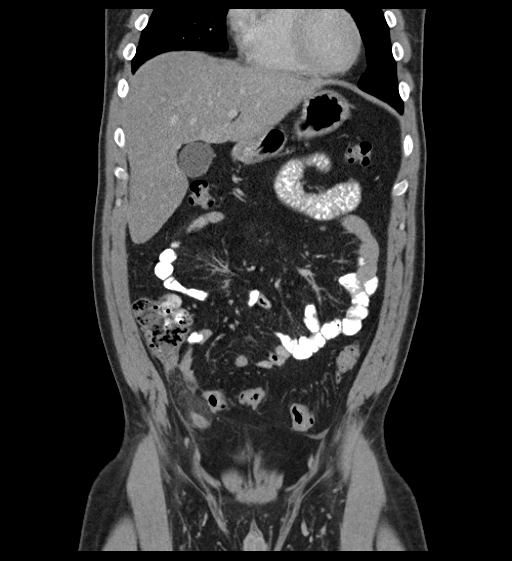
[im 40/90  soft-tissue]
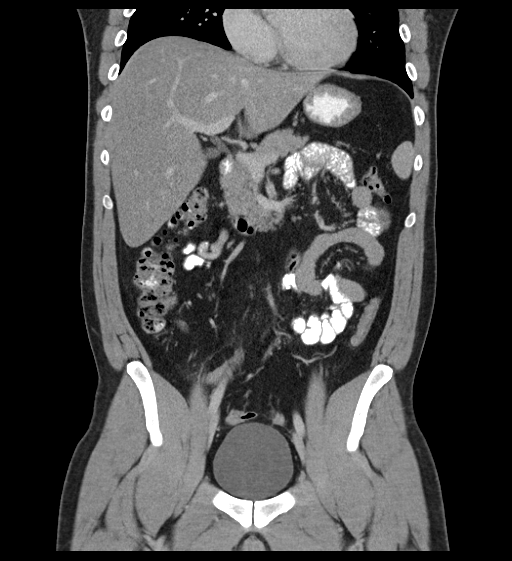
[im 50/90  soft-tissue]
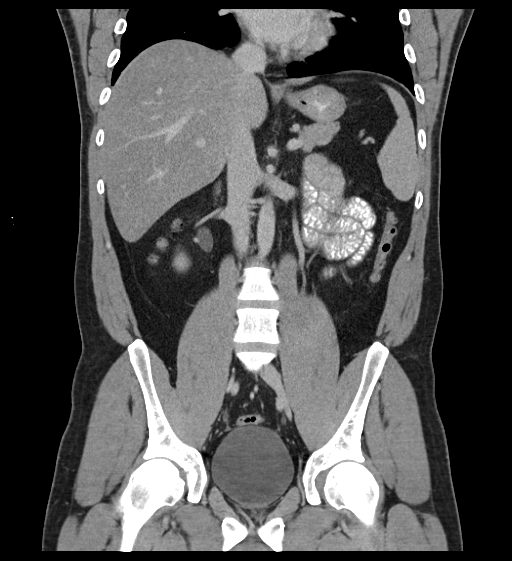

[16 of 46 positions shown; findings below may reference images not displayed]

FINDINGS: There is minimal atelectatic change in lung bases. Lung bases are
otherwise clear.

There is hepatic steatosis. No focal liver lesions are identified.
Gallbladder wall is not thickened. There is no appreciable biliary
duct dilatation.

Spleen, pancreas, and adrenals appear normal.

There is a 1.1 x 0.8 cm mass arising from the posterior mid left
kidney which has attenuation values higher than is expected with a
cyst. No other renal masses are appreciated. There is no
hydronephrosis on either side. There is an extrarenal pelvis on each
side, an anatomic variant. There is no renal or ureteral calculus on
either side.

In the pelvis, the urinary bladder is midline with normal wall
thickness. There is mild fat in each inguinal ring. There is no
pelvic mass or pelvic fluid collection. There are sigmoid
diverticula without diverticulitis.

There is thickening of the wall of the appendix with slight
enhancement the appendiceal wall and surrounding mesenteric
inflammation. This appearance is consistent with early acute
appendiceal inflammation. No abscess is seen in this area.

There is no bowel obstruction.  No free air or portal venous air.

There is no ascites, adenopathy, or abscess in the abdomen or
pelvis. There is no abdominal aortic aneurysm. There are no blastic
or lytic bone lesions.
IMPRESSION: Evidence of acute appendiceal inflammation.

1.1 x 0.8 cm left renal mass with attenuation values higher than is
expected with a simple cyst. Further evaluation with pre and post
contrast MRI should be considered. Pre and post contrast CT could
alternatively be performed, but would likely be of decreased
accuracy given lesion size.

No renal or ureteral calculus. No hydronephrosis. No bowel
obstruction. No abscess. There are multiple sigmoid diverticula
without diverticulitis.

There is hepatic steatosis.

Critical Value/emergent results were called by telephone at the time
of interpretation on 07/19/2014 at [DATE] to Dr. HAGOP DAO ,
who verbally acknowledged these results.

## 2017-08-03 DIAGNOSIS — I10 Essential (primary) hypertension: Secondary | ICD-10-CM | POA: Diagnosis not present

## 2018-02-03 DIAGNOSIS — Z6831 Body mass index (BMI) 31.0-31.9, adult: Secondary | ICD-10-CM | POA: Diagnosis not present

## 2018-02-03 DIAGNOSIS — E669 Obesity, unspecified: Secondary | ICD-10-CM | POA: Diagnosis not present

## 2018-02-03 DIAGNOSIS — I10 Essential (primary) hypertension: Secondary | ICD-10-CM | POA: Diagnosis not present

## 2018-03-10 DIAGNOSIS — L578 Other skin changes due to chronic exposure to nonionizing radiation: Secondary | ICD-10-CM | POA: Diagnosis not present

## 2019-02-20 ENCOUNTER — Emergency Department (HOSPITAL_COMMUNITY): Payer: 59

## 2019-02-20 ENCOUNTER — Other Ambulatory Visit: Payer: Self-pay

## 2019-02-20 ENCOUNTER — Emergency Department (HOSPITAL_COMMUNITY)
Admission: EM | Admit: 2019-02-20 | Discharge: 2019-02-20 | Disposition: A | Discharge status: Home / Self Care | Payer: 59 | Attending: Emergency Medicine | Admitting: Emergency Medicine

## 2019-02-20 DIAGNOSIS — I10 Essential (primary) hypertension: Secondary | ICD-10-CM | POA: Diagnosis not present

## 2019-02-20 DIAGNOSIS — Z79899 Other long term (current) drug therapy: Secondary | ICD-10-CM | POA: Diagnosis not present

## 2019-02-20 DIAGNOSIS — N200 Calculus of kidney: Secondary | ICD-10-CM

## 2019-02-20 DIAGNOSIS — R1032 Left lower quadrant pain: Secondary | ICD-10-CM | POA: Diagnosis present

## 2019-02-20 LAB — CBC WITH DIFFERENTIAL/PLATELET
Abs Immature Granulocytes: 0.06 10*3/uL (ref 0.00–0.07)
Basophils Absolute: 0 10*3/uL (ref 0.0–0.1)
Basophils Relative: 0 %
Eosinophils Absolute: 0 10*3/uL (ref 0.0–0.5)
Eosinophils Relative: 0 %
HCT: 46 % (ref 39.0–52.0)
Hemoglobin: 16.1 g/dL (ref 13.0–17.0)
Immature Granulocytes: 1 %
Lymphocytes Relative: 9 %
Lymphs Abs: 0.9 10*3/uL (ref 0.7–4.0)
MCH: 28.3 pg (ref 26.0–34.0)
MCHC: 35 g/dL (ref 30.0–36.0)
MCV: 81 fL (ref 80.0–100.0)
Monocytes Absolute: 0.4 10*3/uL (ref 0.1–1.0)
Monocytes Relative: 3 %
Neutro Abs: 9.4 10*3/uL — ABNORMAL HIGH (ref 1.7–7.7)
Neutrophils Relative %: 87 %
Platelets: 290 10*3/uL (ref 150–400)
RBC: 5.68 MIL/uL (ref 4.22–5.81)
RDW: 11.5 % (ref 11.5–15.5)
WBC: 10.8 10*3/uL — ABNORMAL HIGH (ref 4.0–10.5)
nRBC: 0 % (ref 0.0–0.2)

## 2019-02-20 LAB — URINALYSIS, ROUTINE W REFLEX MICROSCOPIC
Bacteria, UA: NONE SEEN
Bilirubin Urine: NEGATIVE
Glucose, UA: 50 mg/dL — AB
Ketones, ur: NEGATIVE mg/dL
Leukocytes,Ua: NEGATIVE
Nitrite: NEGATIVE
Protein, ur: NEGATIVE mg/dL
Specific Gravity, Urine: 1.008 (ref 1.005–1.030)
pH: 6 (ref 5.0–8.0)

## 2019-02-20 LAB — BASIC METABOLIC PANEL
Anion gap: 11 (ref 5–15)
BUN: 14 mg/dL (ref 6–20)
CO2: 26 mmol/L (ref 22–32)
Calcium: 9.1 mg/dL (ref 8.9–10.3)
Chloride: 102 mmol/L (ref 98–111)
Creatinine, Ser: 1 mg/dL (ref 0.61–1.24)
GFR calc Af Amer: >60 mL/min (ref >60)
GFR calc non Af Amer: >60 mL/min (ref >60)
Glucose, Bld: 106 mg/dL — ABNORMAL HIGH (ref 70–99)
Potassium: 3.8 mmol/L (ref 3.5–5.1)
Sodium: 139 mmol/L (ref 135–145)

## 2019-02-20 MED ORDER — TAMSULOSIN HCL 0.4 MG PO CAPS: 0.4000 mg | ORAL_CAPSULE | Freq: Every day | ORAL | 0 refills | Status: AC

## 2019-02-20 MED ORDER — HYDROCODONE-ACETAMINOPHEN 5-325 MG PO TABS: 1.0000 | ORAL_TABLET | Freq: Four times a day (QID) | ORAL | 0 refills | Status: AC | PRN

## 2019-02-20 NOTE — ED Provider Notes (Signed)
[facility] EMERGENCY DEPARTMENT Provider Note   CSN: [id number] Arrival date & time: 02/20/19  6237     History Chief Complaint  Patient presents with  . Flank Pain    Willie Wood is a 34 y.o. male.  The history is provided by the patient and medical records. No language interpreter was used.  Flank Pain   Willie Wood is a 34 y.o. male who presents to the Emergency Department complaining of flank pain. He presents the emergency department for evaluation of sudden onset left flank pain that radiated to the left lower quadrant and groin that started at 430. Pain was initially constant and severe in nature with associated nausea. He is pain suddenly resolved on ED arrival. He denies any fevers. He did have that sensation to urinate but no changes in his urination. No prior similar symptoms. Denies any fevers, dysuria, diarrhea.    Past Medical History:  Diagnosis Date  . Exercise-induced asthma   . Hypertension    HCTZ    Patient Active Problem List   Diagnosis Date Noted  . Appendicitis 07/19/2014    Past Surgical History:  Procedure Laterality Date  . APPENDECTOMY  07/19/2014  . LAPAROSCOPIC APPENDECTOMY N/A 07/19/2014   Procedure: APPENDECTOMY LAPAROSCOPIC;  Surgeon: Chevis Pretty III, MD;  Location: [facility] OR;  Service: General;  Laterality: N/A;       Family History  Problem Relation Age of Onset  . Hypertension Father     Social History   Tobacco Use  . Smoking status: Never Smoker  . Smokeless tobacco: Never Used  Substance Use Topics  . Alcohol use: No  . Drug use: No    Home Medications Prior to Admission medications   Medication Sig Start Date End Date Taking? Authorizing Provider  bismuth subsalicylate (PEPTO BISMOL) 262 MG/15ML suspension Take 30 mLs by mouth once.    [provider]  dimenhyDRINATE (DRAMAMINE) 50 MG tablet Take 50 mg by mouth every 8 (eight) hours as needed for nausea.    [provider]  hydrochlorothiazide (MICROZIDE) 12.5 MG capsule Take 12.5 mg by mouth daily.    [provider]  HYDROcodone-acetaminophen (NORCO/VICODIN) 5-325 MG tablet Take 1 tablet by mouth every 6 (six) hours as needed. 02/20/19   Tilden Fossa, MD  tamsulosin (FLOMAX) 0.4 MG CAPS capsule Take 1 capsule (0.4 mg total) by mouth daily. 02/20/19   Tilden Fossa, MD    Allergies    Patient has no known allergies.  Review of Systems   Review of Systems  Genitourinary: Positive for flank pain.  All other systems reviewed and are negative.   Physical Exam Updated Vital Signs BP 128/69   Pulse 76   Temp 98.6 F (37 C) (Oral)   Resp 16   SpO2 97%   Physical Exam Vitals and nursing note reviewed.  Constitutional:      Appearance: He is well-developed.  HENT:     Head: Normocephalic and atraumatic.  Cardiovascular:     Rate and Rhythm: Normal rate and regular rhythm.     Heart sounds: No murmur.  Pulmonary:     Effort: Pulmonary effort is normal. No respiratory distress.     Breath sounds: Normal breath sounds.  Abdominal:     Palpations: Abdomen is soft.     Tenderness: There is no abdominal tenderness. There is no guarding or rebound.  Musculoskeletal:        General: No tenderness.  Skin:    General: Skin is  warm and dry.  Neurological:     Mental Status: He is alert and oriented to person, place, and time.  Psychiatric:        Behavior: Behavior normal.     ED Results / Procedures / Treatments   Labs (all labs ordered are listed, but only abnormal results are displayed) Labs Reviewed  URINALYSIS, ROUTINE W REFLEX MICROSCOPIC - Abnormal; Notable for the following components:      Result Value   Color, Urine STRAW (*)    Glucose, UA 50 (*)    Hgb urine dipstick MODERATE (*)    All other components within normal limits  BASIC METABOLIC PANEL - Abnormal; Notable for the following components:   Glucose, Bld 106 (*)    All other components within  normal limits  CBC WITH DIFFERENTIAL/PLATELET - Abnormal; Notable for the following components:   WBC 10.8 (*)    Neutro Abs 9.4 (*)    All other components within normal limits    EKG None  Radiology CT Renal Stone Study  Result Date: 02/20/2019 CLINICAL DATA:  Sudden onset of left flank pain 0400 hours EXAM: CT ABDOMEN AND PELVIS WITHOUT CONTRAST TECHNIQUE: Multidetector CT imaging of the abdomen and pelvis was performed following the standard protocol without IV contrast. COMPARISON:  02/17/2014 FINDINGS: Lower chest: Normal Hepatobiliary: Fatty change of the liver. No focal finding. No calcified gallstones. Pancreas: Normal Spleen: Normal Adrenals/Urinary Tract: Adrenal glands are normal. Right kidney is normal except for a nonobstructing 2 mm stone in the lower pole. Left kidney shows mild fullness of the renal collecting system and renal pelvis, with a 3 mm stone at the UPJ. No stone distal to that. No stone in the bladder. Stomach/Bowel: Normal Vascular/Lymphatic: Normal Reproductive: Normal Other: No free fluid or air. Musculoskeletal: Normal IMPRESSION: 3 mm stone at the left UPJ with mild fullness of the left renal collecting system and renal pelvis. 2 mm nonobstructing stone at the lower pole the right kidney. Mild fatty change of the liver. Electronically Signed   By: Nelson Chimes M.D.   On: 02/20/2019 11:02    Procedures Procedures (including critical care time)  Medications Ordered in ED Medications - No data to display  ED Course  I have reviewed the triage vital signs and the nursing notes.  Pertinent labs & imaging results that were available during my care of the patient were reviewed by me and considered in my medical decision making (see chart for details).    MDM Rules/Calculators/A&P                     Patient here for evaluation of left sided flank pain, resolved on ED arrival. He is non-toxic appearing on evaluation. CT scan demonstrates a left sided ureteral  stone as well as a non-obstructive right renal stone. He continues to be pain free on ED evaluation. UA not consistent with UTI. BMP with normal renal function. Discussed with patient home care for renal colic. Discussed outpatient follow-up and return precautions.  Final Clinical Impression(s) / ED Diagnoses Final diagnoses:  Kidney stone on left side    Rx / DC Orders ED Discharge Orders         Ordered    HYDROcodone-acetaminophen (NORCO/VICODIN) 5-325 MG tablet  Every 6 hours PRN     02/20/19 1143    tamsulosin (FLOMAX) 0.4 MG CAPS capsule  Daily     02/20/19 1143           Quintella Reichert,  MD 02/20/19 1146

## 2019-02-20 NOTE — ED Triage Notes (Signed)
Pt arrives via POV from home with sudden onset left flank pain radiating to abdomen at 4am. States pain is gone now. VSS. NAD at present.

## 2019-02-20 NOTE — Discharge Instructions (Signed)
You have a 3 mm kidney stone on the left side that is causing your pain. You also have a 2 mm kidney stone on the right side that does not appear to be causing any blockage or pain. Drink plenty of fluids. It is important that she get rechecked immediately if you develop any fevers, can't urinate or develop pain on both sides of your back

## 2019-02-20 NOTE — ED Notes (Signed)
Patient verbalizes understanding of discharge instructions. Opportunity for questioning and answers were provided. Pt discharged from ED. 

## 2021-04-01 ENCOUNTER — Other Ambulatory Visit: Payer: Self-pay

## 2021-04-01 ENCOUNTER — Other Ambulatory Visit: Payer: Self-pay | Admitting: Nurse Practitioner

## 2021-04-01 ENCOUNTER — Ambulatory Visit
Admission: RE | Admit: 2021-04-01 | Discharge: 2021-04-01 | Disposition: A | Discharge status: Home / Self Care | Payer: Worker's Compensation | Source: Ambulatory Visit | Attending: Nurse Practitioner | Admitting: Nurse Practitioner

## 2021-04-01 DIAGNOSIS — M25512 Pain in left shoulder: Secondary | ICD-10-CM

## 2022-01-11 ENCOUNTER — Other Ambulatory Visit (HOSPITAL_COMMUNITY): Payer: Self-pay

## 2022-01-11 MED ORDER — HYDROCODONE-ACETAMINOPHEN 5-325 MG PO TABS
1.0000 | ORAL_TABLET | Freq: Four times a day (QID) | ORAL | 0 refills | Status: AC | PRN
  Filled 2022-01-11: qty 10, 3d supply, fill #0

## 2022-01-11 MED ORDER — TAMSULOSIN HCL 0.4 MG PO CAPS
0.4000 mg | ORAL_CAPSULE | Freq: Every day | ORAL | 2 refills | Status: AC
  Filled 2022-01-11: qty 30, 30d supply, fill #0

## 2023-08-22 ENCOUNTER — Other Ambulatory Visit (HOSPITAL_BASED_OUTPATIENT_CLINIC_OR_DEPARTMENT_OTHER): Payer: Self-pay | Admitting: Family Medicine

## 2023-08-22 ENCOUNTER — Encounter (HOSPITAL_BASED_OUTPATIENT_CLINIC_OR_DEPARTMENT_OTHER): Payer: Self-pay | Admitting: *Deleted

## 2023-08-22 DIAGNOSIS — I10 Essential (primary) hypertension: Secondary | ICD-10-CM

## 2023-09-28 ENCOUNTER — Ambulatory Visit (HOSPITAL_BASED_OUTPATIENT_CLINIC_OR_DEPARTMENT_OTHER): Payer: Self-pay | Attending: Family Medicine

## 2023-10-07 ENCOUNTER — Ambulatory Visit (HOSPITAL_BASED_OUTPATIENT_CLINIC_OR_DEPARTMENT_OTHER)
Admission: RE | Admit: 2023-10-07 | Discharge: 2023-10-07 | Disposition: A | Discharge status: Home / Self Care | Payer: Self-pay | Source: Ambulatory Visit | Attending: Family Medicine | Admitting: Family Medicine

## 2023-10-07 DIAGNOSIS — I10 Essential (primary) hypertension: Secondary | ICD-10-CM
